# Patient Record
Sex: Female | Born: 2000 | Race: White | Hispanic: No | Marital: Single | Smoking: Never smoker
Health system: Southern US, Community
[De-identification: ages and names within clinical notes are randomized; demographics above are authoritative.]

---
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
## 2020-07-04 IMAGING — DX DG FOOT COMPLETE 3+V*L*
3 series · 3 of 3 positions shown · non-contrast
Comparison: None.

CLINICAL DATA: Left mid foot pain.

EXAM:
LEFT FOOT - COMPLETE 3+ VIEW

[foot ap]
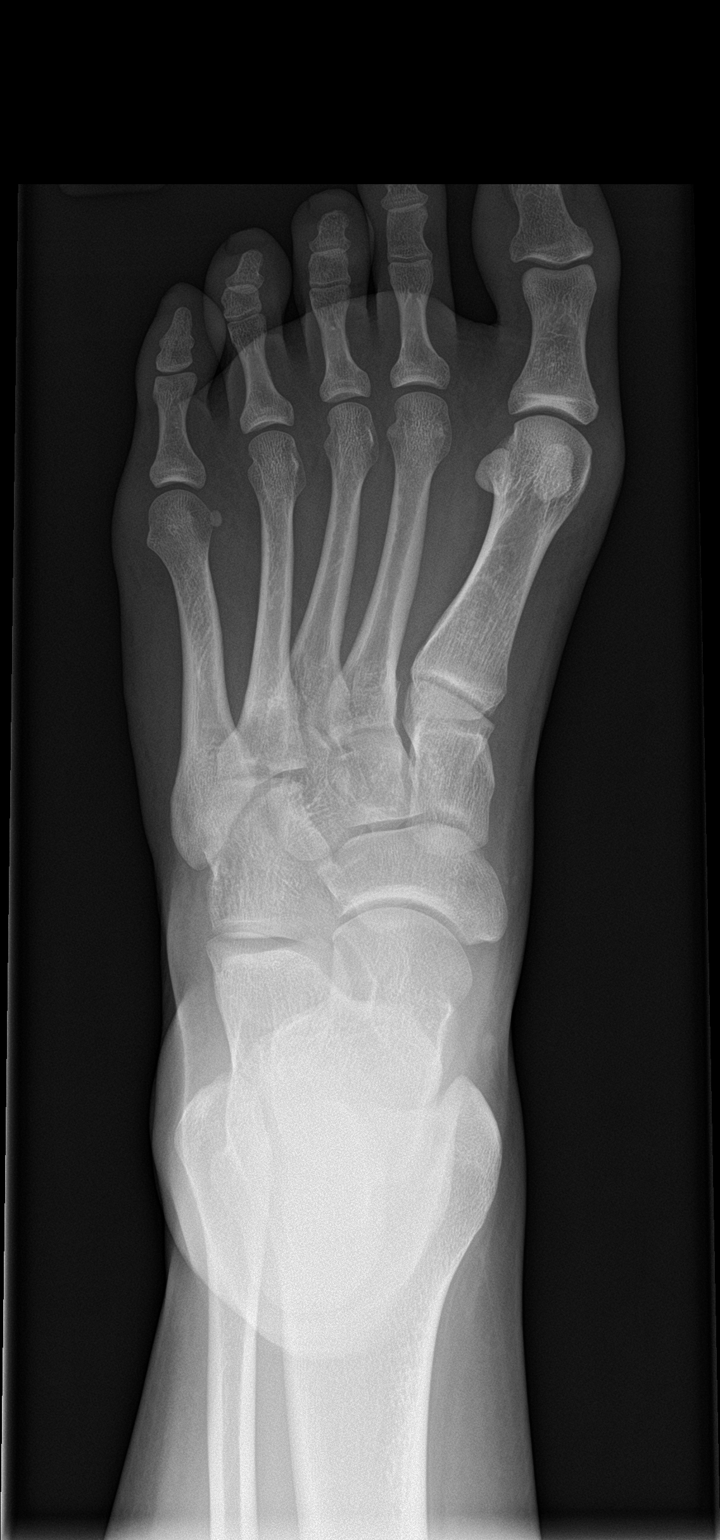

[foot obl]
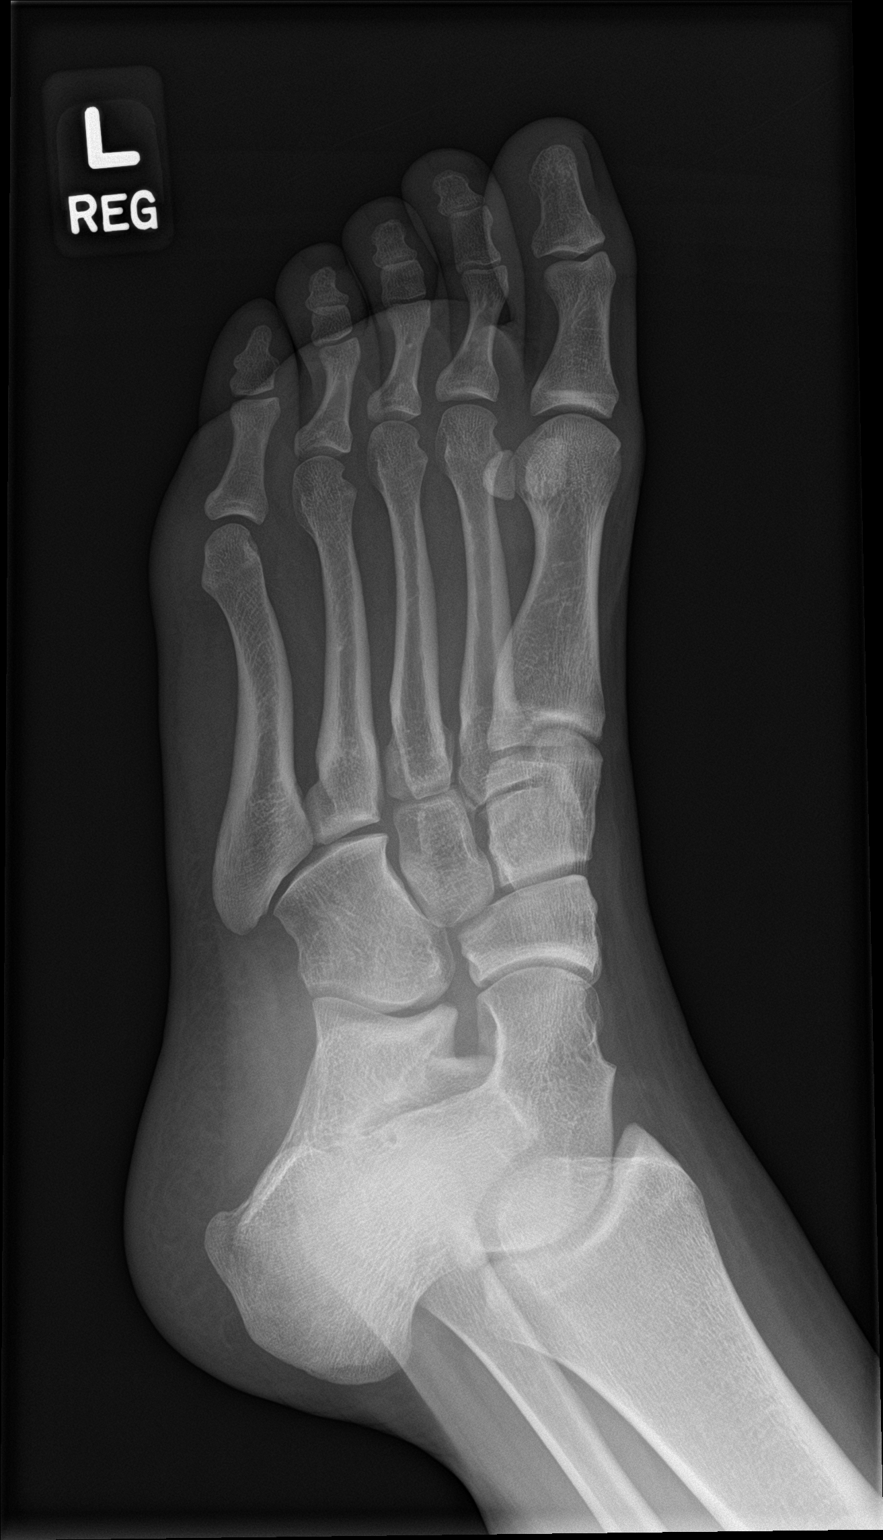

[foot lat]
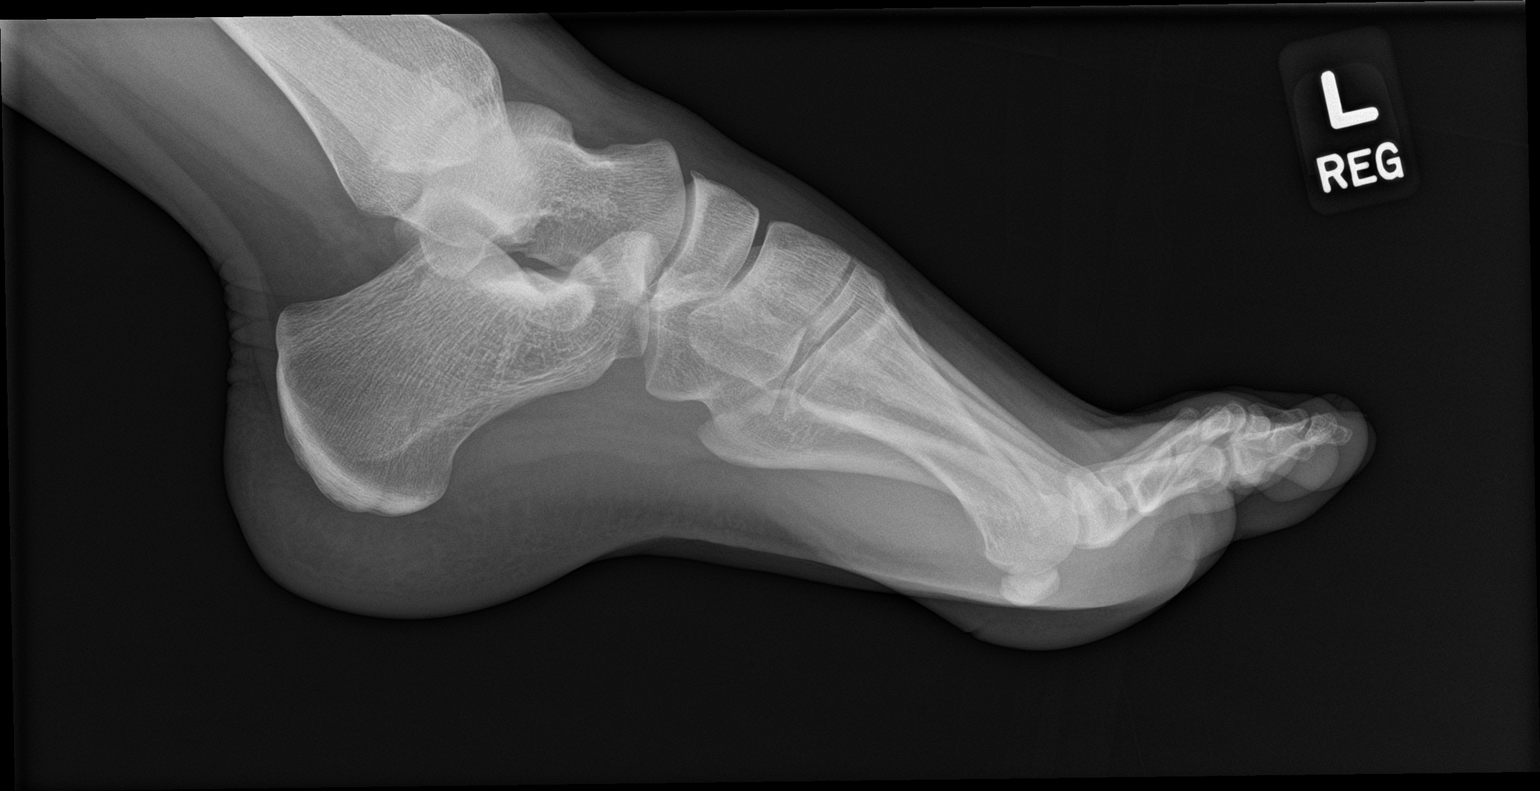

[3 of 3 positions shown; findings below may reference images not displayed]

FINDINGS: There is no evidence of fracture or dislocation. There is no
evidence of arthropathy. No bony lesions or destruction. Soft
tissues are unremarkable.
IMPRESSION: Negative.
